# Patient Record
Sex: Male | Born: 1955 | Race: White | Hispanic: No | Marital: Married | Smoking: Never smoker
Health system: Southern US, Community
[De-identification: ages and names within clinical notes are randomized; demographics above are authoritative.]

## PROBLEM LIST (undated history)

## (undated) DIAGNOSIS — I10 Essential (primary) hypertension: Secondary | ICD-10-CM

---

## 2011-03-20 ENCOUNTER — Inpatient Hospital Stay: Payer: Self-pay | Admitting: *Deleted

## 2011-03-28 ENCOUNTER — Ambulatory Visit: Payer: Self-pay | Admitting: Internal Medicine

## 2011-06-08 ENCOUNTER — Emergency Department: Payer: Self-pay | Admitting: Emergency Medicine

## 2011-06-09 ENCOUNTER — Ambulatory Visit: Payer: Self-pay | Admitting: Internal Medicine

## 2011-08-10 ENCOUNTER — Emergency Department: Payer: Self-pay | Admitting: Internal Medicine

## 2012-05-16 IMAGING — CR DG CHEST 2V
1 series · 2 of 2 positions shown · non-contrast
Comparison: none

REASON FOR EXAM: pain
COMMENTS:   May transport without cardiac monitor

PROCEDURE:     DXR - DXR CHEST PA (OR AP) AND LATERAL  - March 20, 2011  [DATE]
RESULT:     Comparison: None

[Series 1: view not recorded · 0.17mm/px · 2 of 2 slices shown]
[im 1/2]
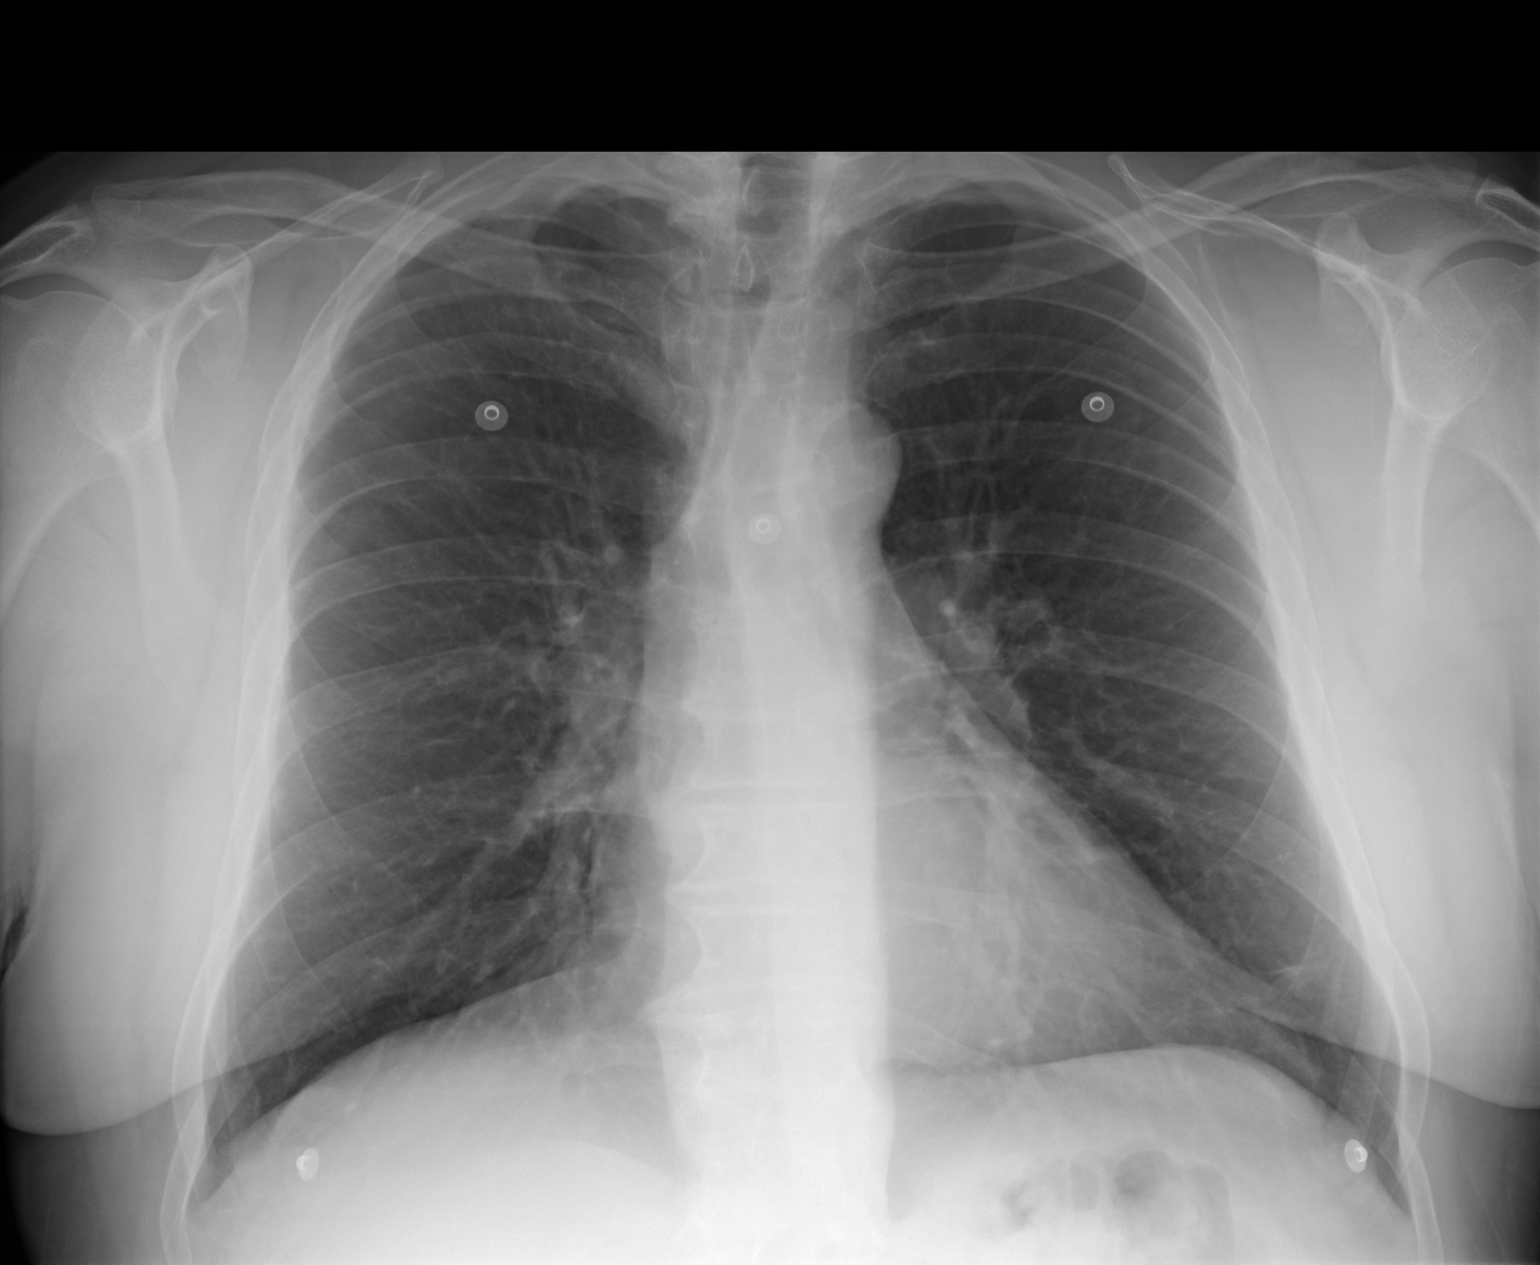
[im 2/2]
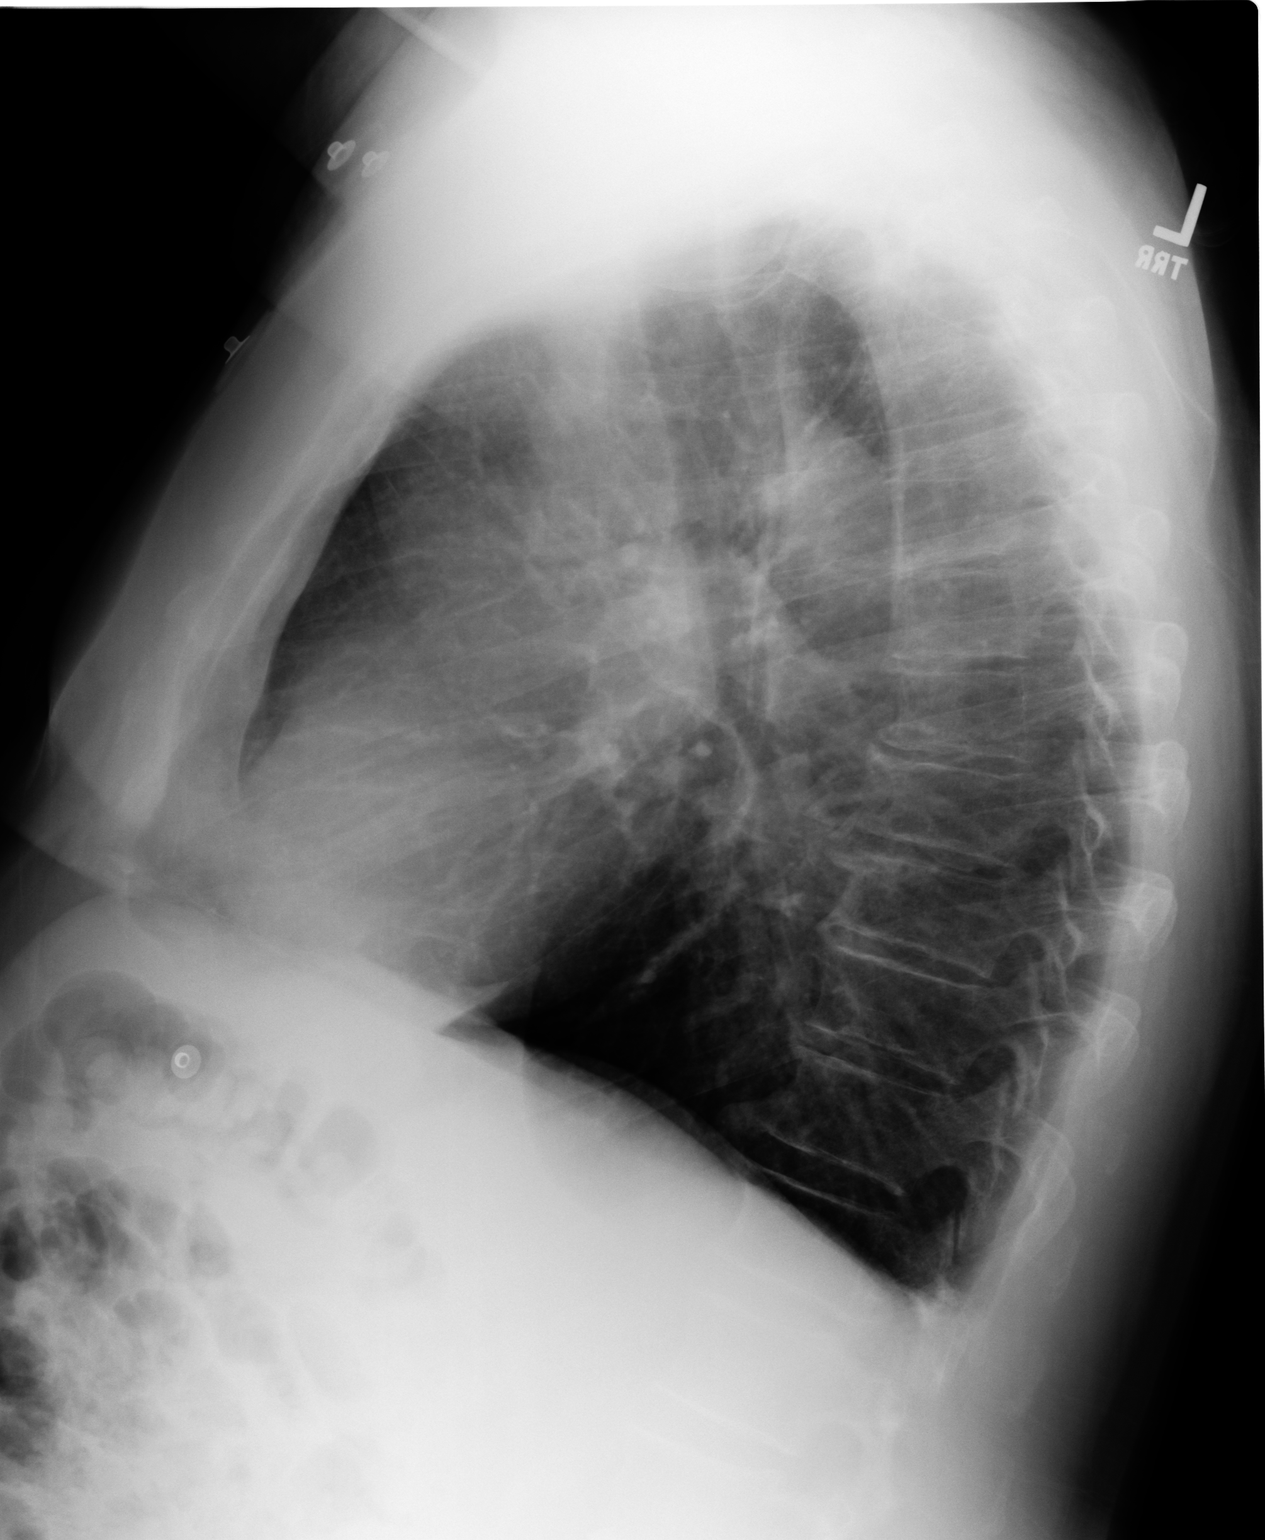

[2 of 2 positions shown; findings below may reference images not displayed]

FINDINGS: Heart is normal in size. Minimal linear opacities in the periphery the left
lower lung likely represent subsegmental atelectasis.
IMPRESSION: Minimal subsegmental atelectasis or scarring in the left lower lung.

## 2012-05-17 IMAGING — US US CAROTID DUPLEX BILAT
1 series · 17 of 24 positions shown · non-contrast
Comparison: none

REASON FOR EXAM: syncope
COMMENTS:

PROCEDURE:     US  - US CAROTID DOPPLER BILATERAL  - March 21, 2011  [DATE]
RESULT:     Comparison: None
TECHNIQUE: Gray-scale, color Doppler, and spectral Doppler images were
obtained of the extracranial carotid artery systems and vertebral arteries
in the neck.

[Series 1: us carotid duplex bilat · 17 of 67 slices shown]
[im 1/67]
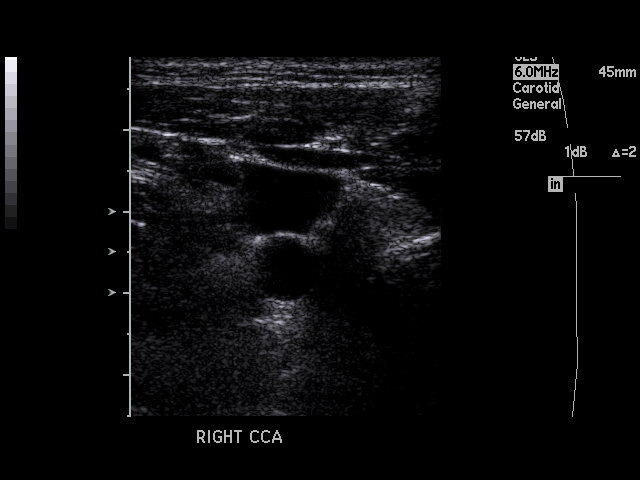
[im 6/67]
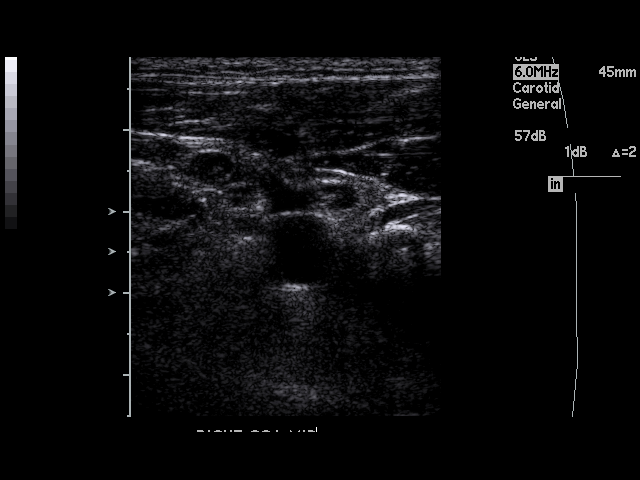
[im 9/67]
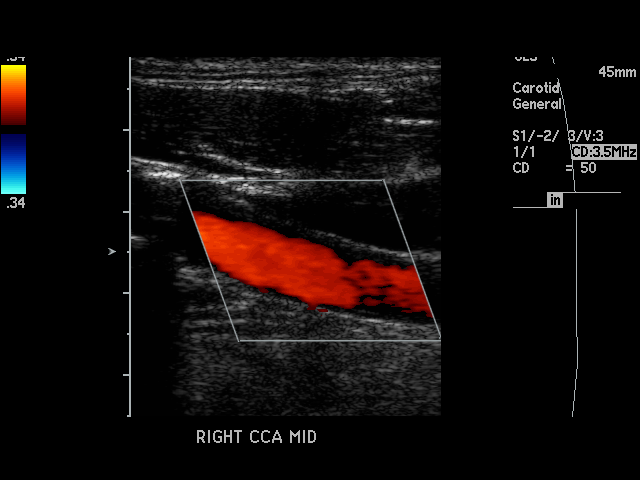
[im 12/67]
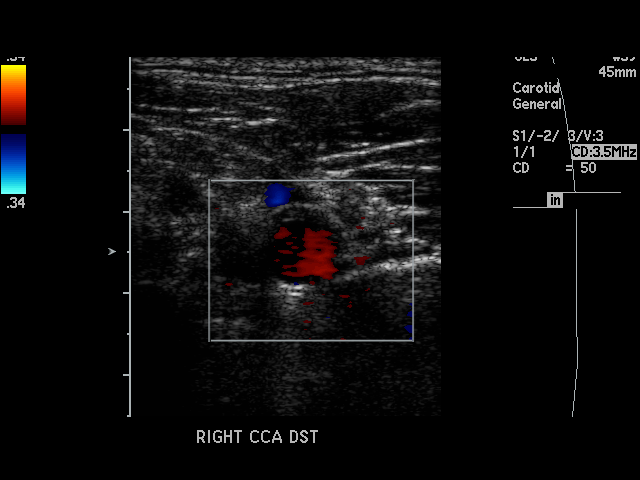
[im 18/67]
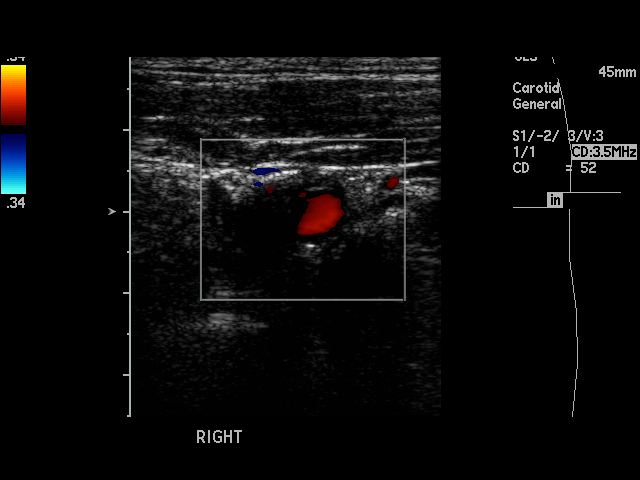
[im 21/67]
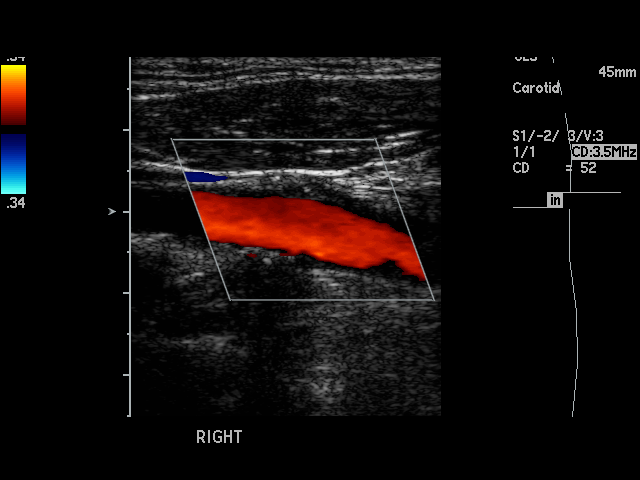
[im 26/67]
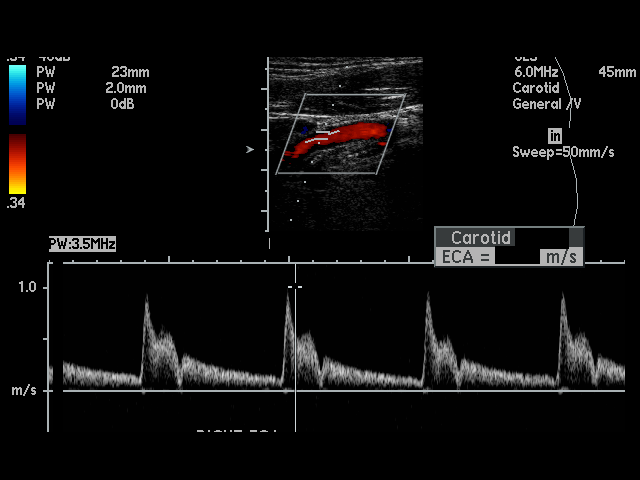
[im 29/67]
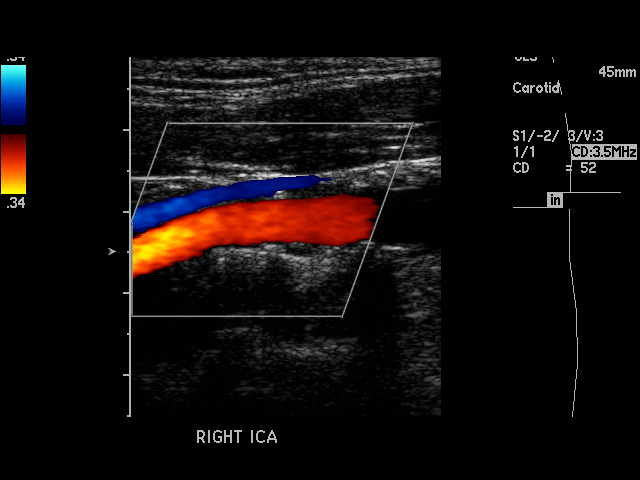
[im 35/67]
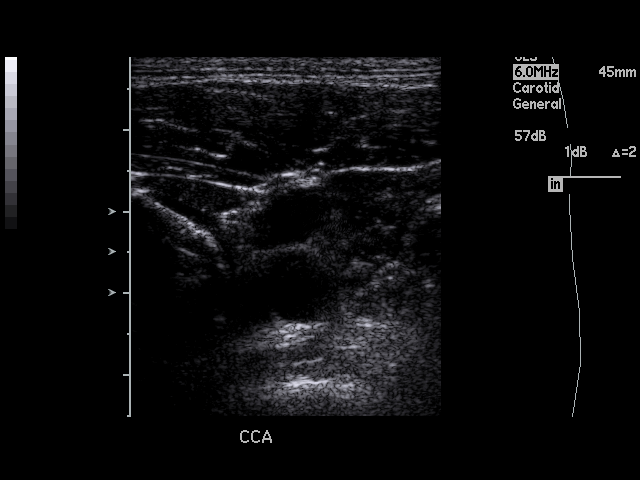
[im 38/67]
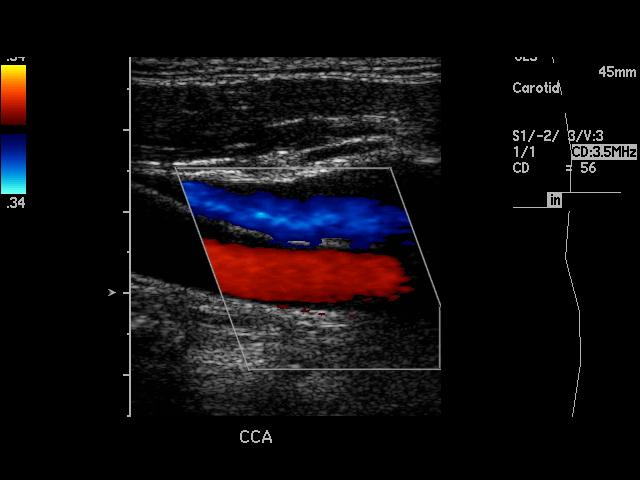
[im 41/67]
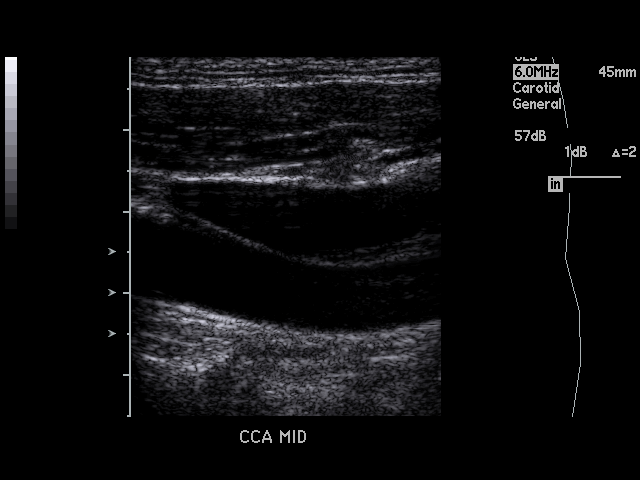
[im 46/67]
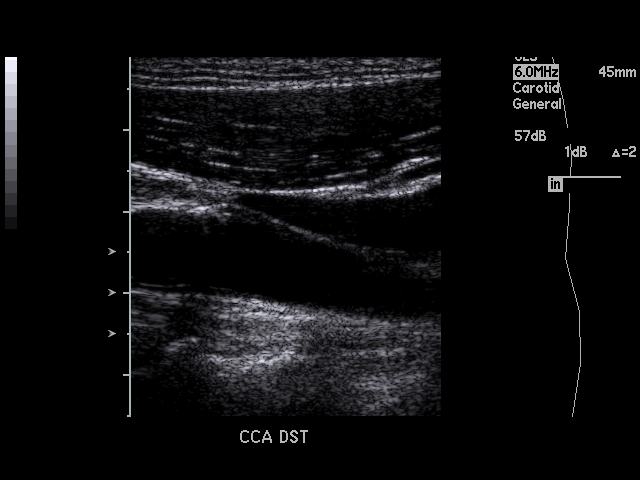
[im 49/67]
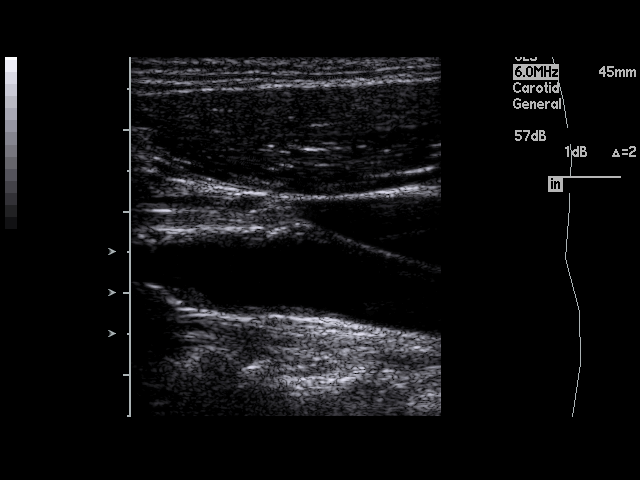
[im 55/67]
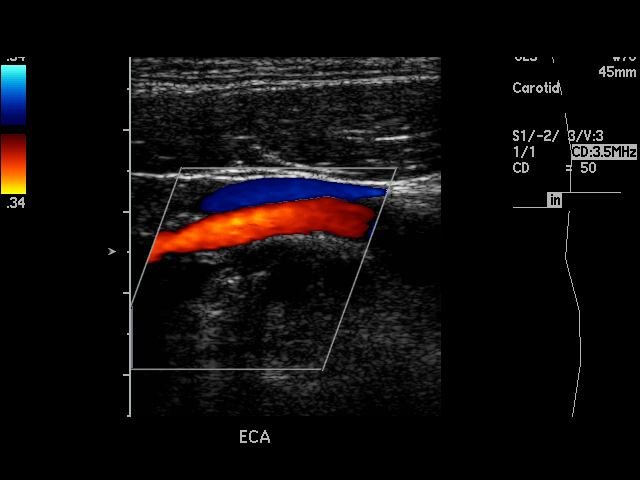
[im 58/67]
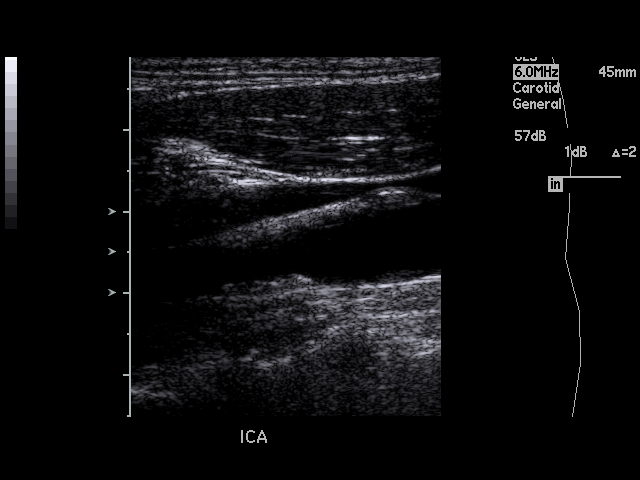
[im 61/67]
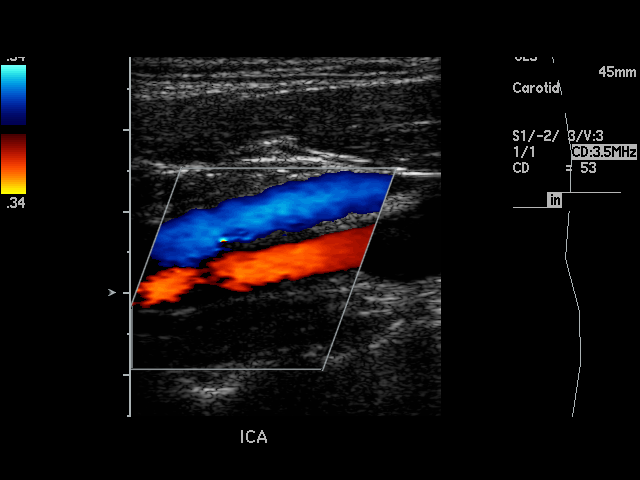
[im 67/67]
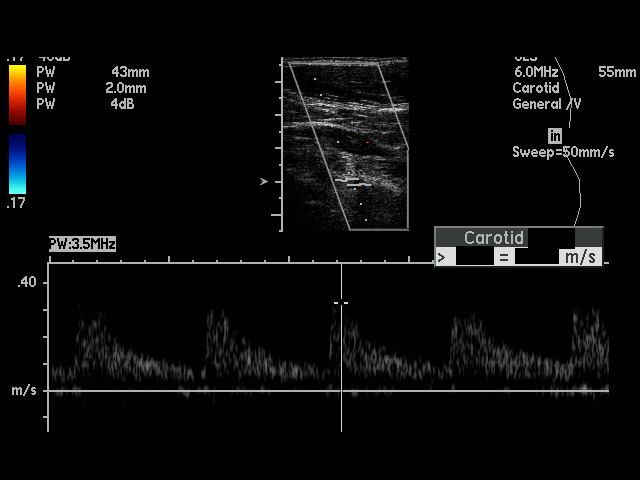

[17 of 24 positions shown; findings below may reference images not displayed]

FINDINGS: Mild atherosclerotic plaques are demonstrated in the carotid bulbs and
internal carotid arteries, bilaterally. By visual inspection there is less
than 50% stenosis. The peak systolic velocities are not elevated. The right
ICA to CCA ratio is 1.1. The left ICA to CCA ratio is 1.0. The vertebral
arteries are patent and antegrade flow.
IMPRESSION: No evidence of hemodynamically significant stenosis in the extracranial
carotid arteries.

## 2012-10-06 IMAGING — CR DG KNEE 1-2V*R*
1 series · 2 of 2 positions shown · non-contrast
Comparison: none

REASON FOR EXAM: fall, pain
COMMENTS:

[Series 1: view not recorded · 0.17mm/px · 2 of 2 slices shown]
[im 1/2]
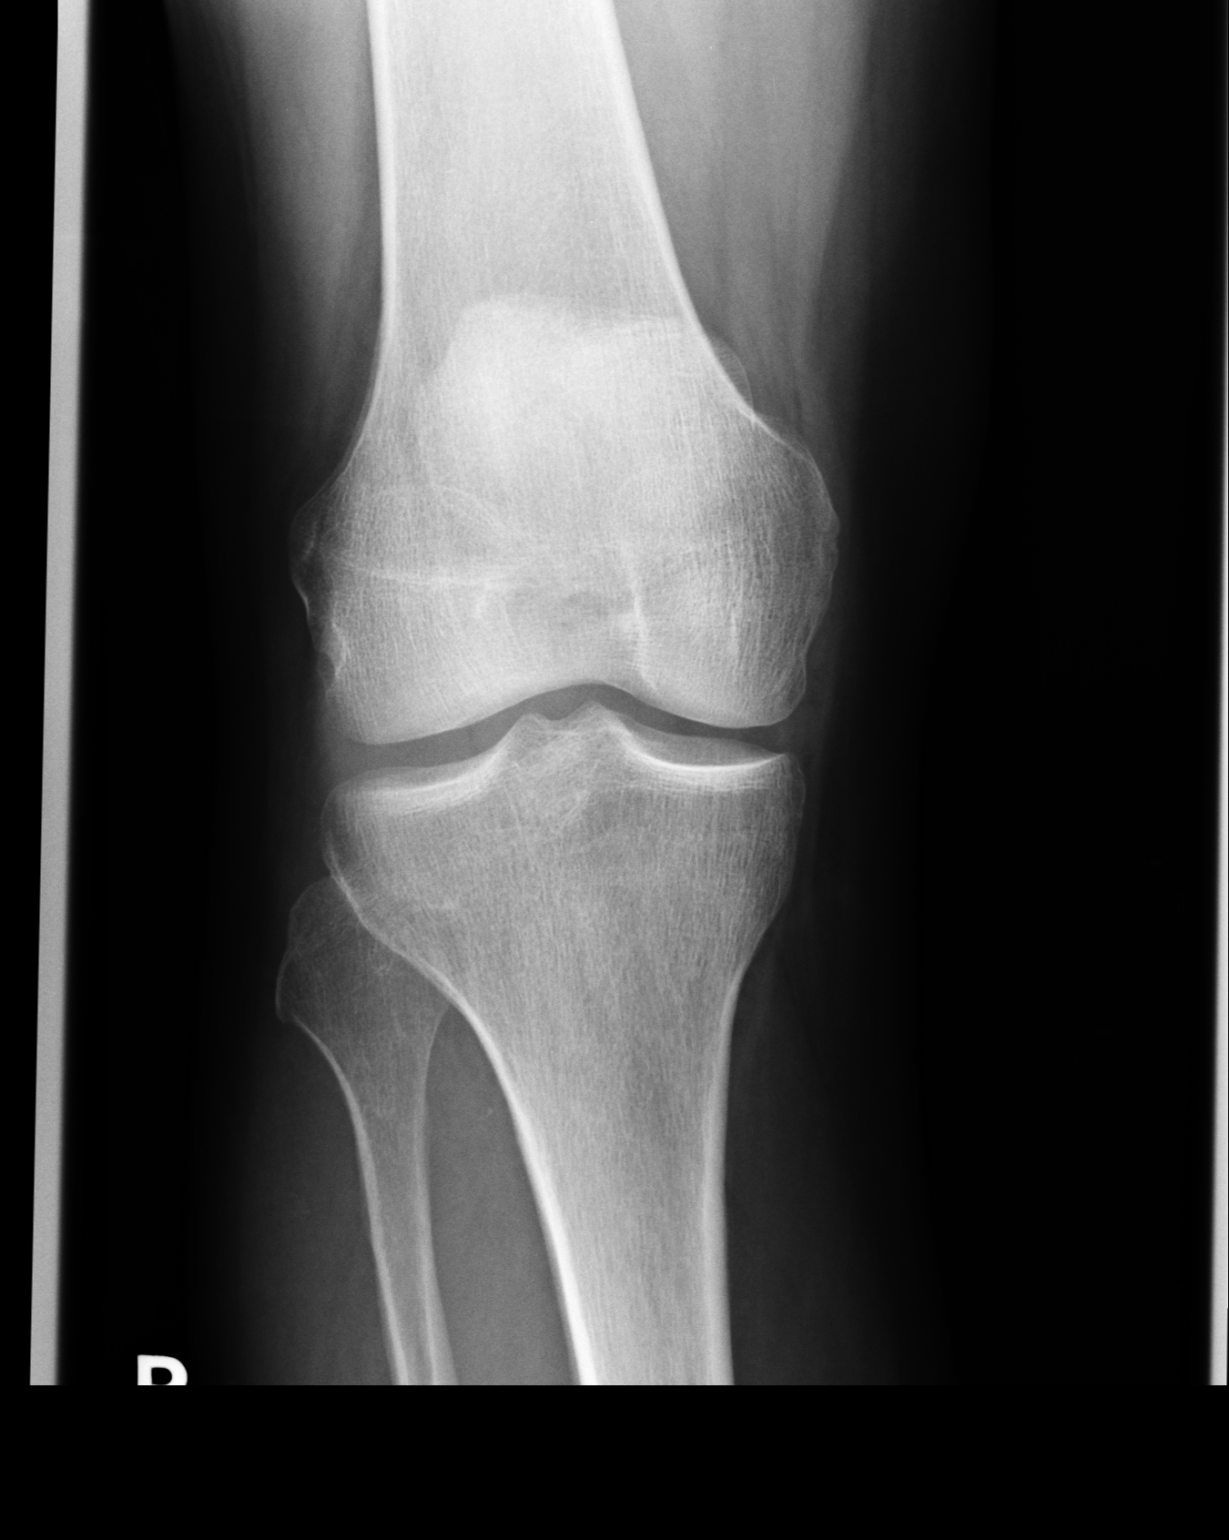
[im 2/2]
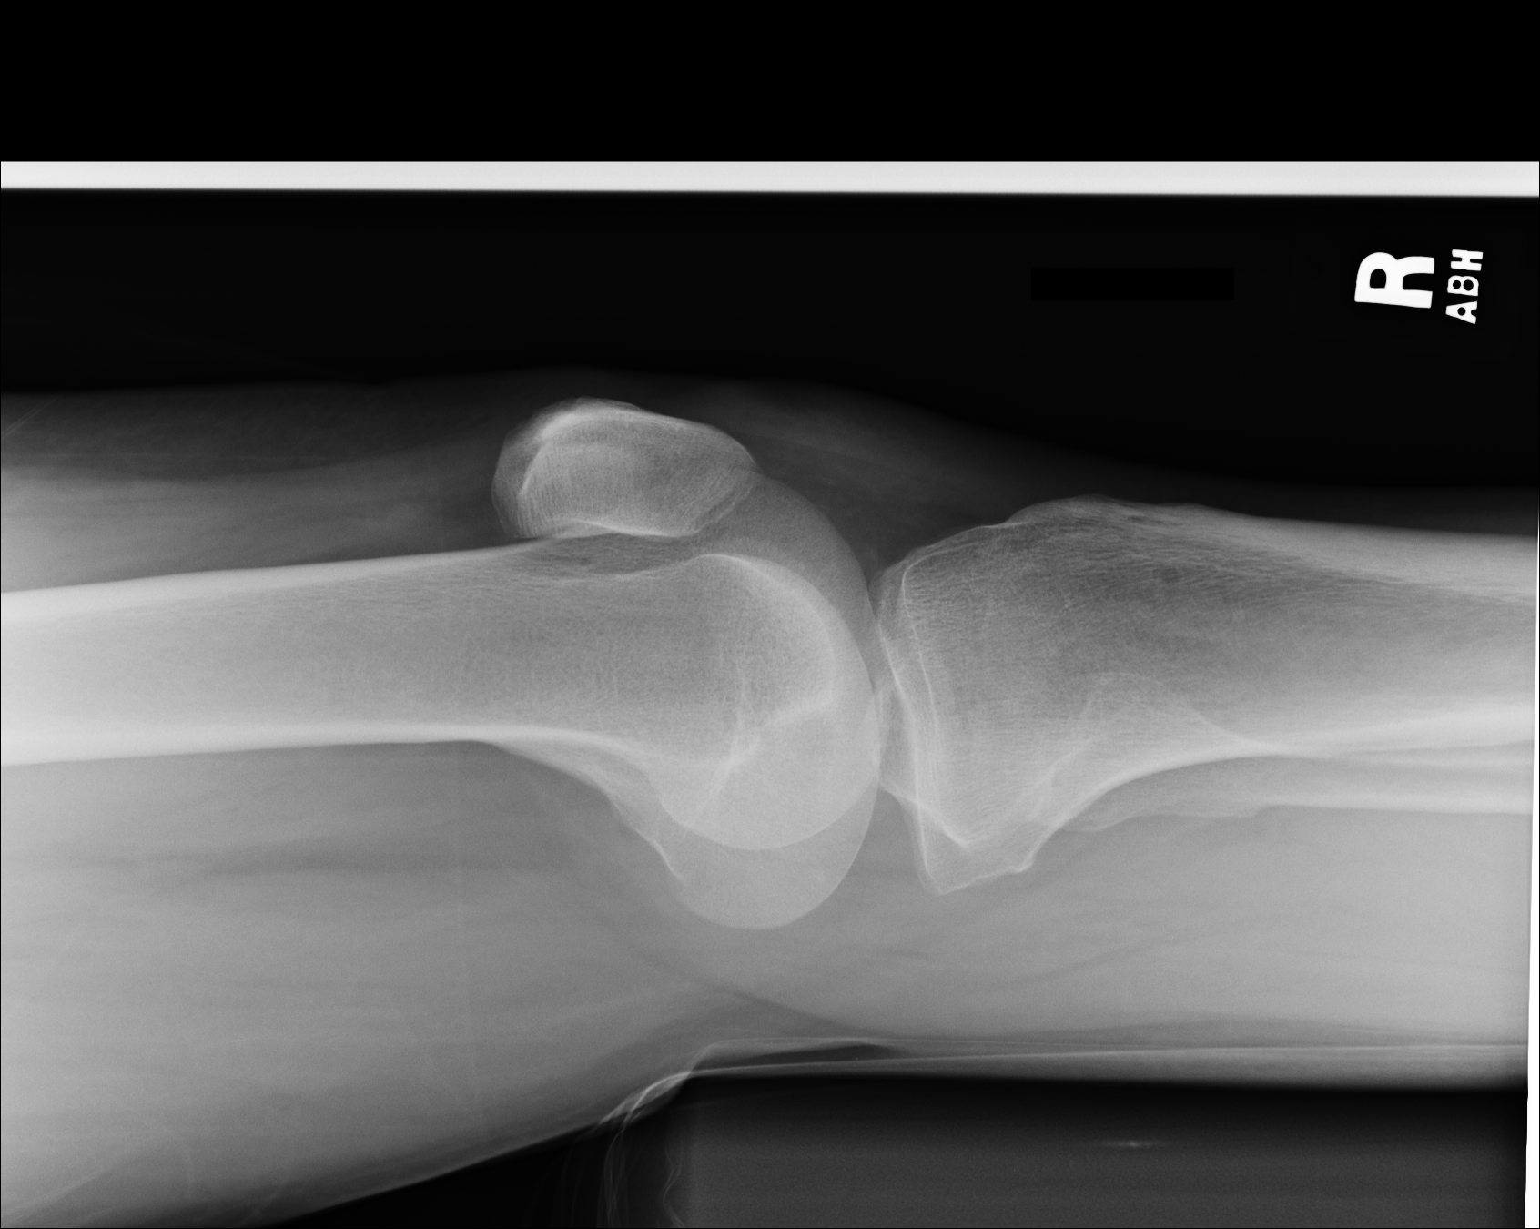

[2 of 2 positions shown; findings below may reference images not displayed]

PROCEDURE:     DXR - DXR KNEE RIGHT AP AND LATERAL  - August 10, 2011  [DATE]

RESULT:     AP and lateral views of the right knee reveal the bones to be
adequately mineralized. I do not see evidence of an acute tibial or femoral
fracture or dislocation. There is subtle contour deformity of the lateral
aspect of the right fibula. A definite fracture line is not demonstrated. No
significant degenerative change is seen. I see no evidence of a joint
effusion.
IMPRESSION: I do not see definite evidence of an acute fracture of the
bones of the right knee. Correlation clinically as to any symptoms over the
proximal fibula is recommended to assure that a poorly visualized fracture
is not present.

## 2013-03-29 ENCOUNTER — Ambulatory Visit: Payer: Self-pay | Admitting: Emergency Medicine

## 2013-03-29 LAB — DOT URINE DIP
Glucose,UR: NEGATIVE mg/dL (ref 0–75)
Protein: NEGATIVE
Specific Gravity: 1.005 (ref 1.003–1.030)

## 2013-03-29 LAB — URINALYSIS, COMPLETE
Bilirubin,UR: NEGATIVE
Glucose,UR: NEGATIVE mg/dL (ref 0–75)
Ketone: NEGATIVE
Leukocyte Esterase: NEGATIVE
Nitrite: NEGATIVE
Ph: 6 (ref 4.5–8.0)
Protein: NEGATIVE
Specific Gravity: 1.005 (ref 1.003–1.030)

## 2013-04-10 ENCOUNTER — Ambulatory Visit: Payer: Self-pay | Admitting: Family Medicine

## 2013-04-10 LAB — DOT URINE DIP
Glucose,UR: NEGATIVE mg/dL (ref 0–75)
Protein: NEGATIVE
Specific Gravity: 1.01 (ref 1.003–1.030)

## 2015-06-28 ENCOUNTER — Telehealth: Payer: Self-pay | Admitting: Unknown Physician Specialty

## 2015-06-28 NOTE — Telephone Encounter (Signed)
Patient returned my call. Patient is starting a new job on Monday and had to have some things done. Patient had a physical in [place] on Tuesday and was told he could get a  stress test done anywhere. Patient is very confused. Is this something we need to put an order in for? Or should he see his cardiologist?

## 2015-06-28 NOTE — Telephone Encounter (Signed)
Tried to call patient but there was no answer so I left him a voicemail to return my call.

## 2015-06-28 NOTE — Telephone Encounter (Signed)
PT CAME IN AND HAS SOME THINGS HE NEEDS TO SPEAK TO CHERYL ABOUT SOME THINGS THAT HE DOESN'T UNDERSTAND AND HE WASN'T WILLING TO TELL ME ANYTHING BECAUSE I CANT DO ANYTHING ABOUT IT. HE SAYS HE TOOK A PHYSICAL SOMEWHERE ELSE AND HE NEEDS A STRESS TEST EVERY 2 YEARS AND HE WAS UNAWARE OF THIS. HE SAYS HIS PHYSICAL CARD DOESN'T EXPIRE UNTIL NEXT YEAR. UNTIL HE GETS A STRESS TEST HE CANT GET HIS NEW CARD TO START HIS NEW JOB. HE IS SUPPOSED TO START HIS NEW JOB Monday. HIS LAST STRESS TEST WAS DONE 3 YEARS AGO.

## 2015-06-29 ENCOUNTER — Other Ambulatory Visit: Payer: Self-pay

## 2015-06-29 NOTE — Telephone Encounter (Signed)
Pt needs to see Cardiologist

## 2015-07-02 ENCOUNTER — Telehealth: Payer: Self-pay | Admitting: Unknown Physician Specialty

## 2015-07-02 NOTE — Telephone Encounter (Signed)
Patient was instructed to call his cardiologist to get his stress test done.

## 2016-10-07 ENCOUNTER — Emergency Department
Admission: EM | Admit: 2016-10-07 | Discharge: 2016-10-07 | Disposition: A | Discharge status: Home / Self Care | Payer: Self-pay | Attending: Emergency Medicine | Admitting: Emergency Medicine

## 2016-10-07 ENCOUNTER — Emergency Department: Payer: Self-pay

## 2016-10-07 ENCOUNTER — Encounter: Payer: Self-pay | Admitting: Emergency Medicine

## 2016-10-07 DIAGNOSIS — Y929 Unspecified place or not applicable: Secondary | ICD-10-CM | POA: Insufficient documentation

## 2016-10-07 DIAGNOSIS — I10 Essential (primary) hypertension: Secondary | ICD-10-CM | POA: Insufficient documentation

## 2016-10-07 DIAGNOSIS — Y990 Civilian activity done for income or pay: Secondary | ICD-10-CM | POA: Insufficient documentation

## 2016-10-07 DIAGNOSIS — Y93F2 Activity, caregiving, lifting: Secondary | ICD-10-CM | POA: Insufficient documentation

## 2016-10-07 DIAGNOSIS — S86812A Strain of other muscle(s) and tendon(s) at lower leg level, left leg, initial encounter: Secondary | ICD-10-CM | POA: Insufficient documentation

## 2016-10-07 DIAGNOSIS — S86112A Strain of other muscle(s) and tendon(s) of posterior muscle group at lower leg level, left leg, initial encounter: Secondary | ICD-10-CM

## 2016-10-07 DIAGNOSIS — X500XXA Overexertion from strenuous movement or load, initial encounter: Secondary | ICD-10-CM | POA: Insufficient documentation

## 2016-10-07 HISTORY — DX: Essential (primary) hypertension: I10

## 2016-10-07 MED ORDER — TRAMADOL HCL 50 MG PO TABS: 50.0000 mg | ORAL_TABLET | Freq: Four times a day (QID) | ORAL | 0 refills | Status: AC | PRN

## 2016-10-07 MED ORDER — IBUPROFEN 800 MG PO TABS: 800.0000 mg | ORAL_TABLET | Freq: Three times a day (TID) | ORAL | 0 refills | Status: AC | PRN

## 2016-10-07 NOTE — ED Notes (Signed)
Patient transported to Ultrasound 

## 2016-10-07 NOTE — Discharge Instructions (Signed)
Wear splint and ambulate with crutches 3-5 days as directed. Follow orthopedics if no improvement in one week.

## 2016-10-07 NOTE — ED Triage Notes (Signed)
Pt states he hurt his left leg at work on Monday. Pt ambulated into triage with no difficulty noted.

## 2016-10-07 NOTE — ED Provider Notes (Signed)
[facility] Emergency Department Provider Note   ____________________________________________   First MD Initiated Contact with Patient 10/07/16 1204     (approximate)  I have reviewed the triage vital signs and the nursing notes.   HISTORY  Chief Complaint Leg Pain    HPI Matthew Golden is a 60 y.o. male patient complaining of left calf pain secondary to lifting incident at work yesterday. Patient state this morning he noticed increased swelling to the calf and ecchymosis to the lateral inferior ankle. Patient stated pain increases with ambulation. Patient placed in Ace wrap on the catheter yesterday. Patient takes Plavix every other day and a baby aspirin every day. Patient also has a heart stent and takes 3 blood pressure medications. Patient rates his pain today is 8/10. Patient is denying any chest pain or shortness of breath. No other palliative measures taken for this complaint. Patient state pain also increase of his leg with dorsal flexion.   Past Medical History:  Diagnosis Date  . Hypertension     There are no active problems to display for this patient.   History reviewed. No pertinent surgical history.  Prior to Admission medications   Medication Sig Start Date End Date Taking? Authorizing Provider  ibuprofen (ADVIL,MOTRIN) 800 MG tablet Take 1 tablet (800 mg total) by mouth every 8 (eight) hours as needed. 10/07/16   Joni Reiningonald K Lonnetta Kniskern, PA-C  traMADol (ULTRAM) 50 MG tablet Take 1 tablet (50 mg total) by mouth every 6 (six) hours as needed. 10/07/16 10/07/17  Joni Reiningonald K Naydeline Morace, PA-C    Allergies Review of patient's allergies indicates no known allergies.  No family history on file.  Social History Social History  Substance Use Topics  . Smoking status: Never Smoker  . Smokeless tobacco: Never Used  . Alcohol use No    Review of Systems Constitutional: No fever/chills Eyes: No visual changes. ENT: No sore throat. Cardiovascular:  Denies chest pain. Respiratory: Denies shortness of breath. Gastrointestinal: No abdominal pain.  No nausea, no vomiting.  No diarrhea.  No constipation. Genitourinary: Negative for dysuria. Musculoskeletal: Left calf pain. Skin: Negative for rash. Neurological: Negative for headaches, focal weakness or numbness. Endocrine:Hypertension ____________________________________________   PHYSICAL EXAM:  VITAL SIGNS: ED Triage Vitals  Enc Vitals Group     BP 10/07/16 1129 (!) 155/67     Pulse Rate 10/07/16 1129 73     Resp 10/07/16 1129 18     Temp 10/07/16 1129 97.8 F (36.6 C)     Temp Source 10/07/16 1129 Oral     SpO2 10/07/16 1129 95 %     Weight 10/07/16 1129 233 lb (105.7 kg)     Height 10/07/16 1129 5\' 10"  (1.778 m)     Head Circumference --      Peak Flow --      Pain Score 10/07/16 1130 8     Pain Loc --      Pain Edu? --      Excl. in GC? --     Constitutional: Alert and oriented. Well appearing and in no acute distress. Eyes: Conjunctivae are normal. PERRL. EOMI. Head: Atraumatic. Nose: No congestion/rhinnorhea. Mouth/Throat: Mucous membranes are moist.  Oropharynx non-erythematous. Neck: No stridor.  No cervical spine tenderness to palpation. Hematological/Lymphatic/Immunilogical: No cervical lymphadenopathy. Cardiovascular: Normal rate, regular rhythm. Grossly normal heart sounds.  Good peripheral circulation.Elevated blood pressure Respiratory: Normal respiratory effort.  No retractions. Lungs CTAB. Gastrointestinal: Soft and nontender. No distention. No abdominal bruits. No CVA tenderness. Musculoskeletal:  No obvious deformity left lower leg. Hours edema to the left calf. Patient is moderate guarding palpation of the left calf.  Neurologic:  Normal speech and language. No gross focal neurologic deficits are appreciated. No gait instability. Skin:  Skin is warm, dry and intact. No rash noted. Descended ecchymosis from the calf to the ankle. Psychiatric: Mood and  affect are normal. Speech and behavior are normal.  ____________________________________________   LABS (all labs ordered are listed, but only abnormal results are displayed)  Labs Reviewed - No data to display ____________________________________________  EKG   ____________________________________________  RADIOLOGY  No  DVT on ultrasound of the left leg. ____________________________________________   PROCEDURES  Procedure(s) performed: None  Procedures  Critical Care performed: No  ____________________________________________   INITIAL IMPRESSION / ASSESSMENT AND PLAN / ED COURSE  Pertinent labs & imaging results that were available during my care of the patient were reviewed by me and considered in my medical decision making (see chart for details).  Gastrocnemius strain left calf. Patient given discharge Instructions.  Clinical Course  Patient placed in a short leg cast and given crutches for ambulation.   ____________________________________________   FINAL CLINICAL IMPRESSION(S) / ED DIAGNOSES  Final diagnoses:  Gastrocnemius muscle strain, left, initial encounter      NEW MEDICATIONS STARTED DURING THIS VISIT:  New Prescriptions   IBUPROFEN (ADVIL,MOTRIN) 800 MG TABLET    Take 1 tablet (800 mg total) by mouth every 8 (eight) hours as needed.   TRAMADOL (ULTRAM) 50 MG TABLET    Take 1 tablet (50 mg total) by mouth every 6 (six) hours as needed.     Note:  This document was prepared using Dragon voice recognition software and may include unintentional dictation errors.    Joni Reining, PA-C 10/07/16 1345    Governor Rooks, MD 10/07/16 [phone]

## 2016-10-07 NOTE — ED Notes (Signed)
Called in the waiting room with no answer. °

## 2018-07-03 IMAGING — US US EXTREM LOW VENOUS*L*
1 series · 14 of 24 positions shown · non-contrast
Comparison: None.

CLINICAL DATA: Left calf pain for 8 days.

EXAM:
LEFT LOWER EXTREMITY VENOUS DOPPLER ULTRASOUND
TECHNIQUE: Gray-scale sonography with graded compression, as well as color
Doppler and duplex ultrasound, were performed to evaluate the deep
venous system from the level of the common femoral vein through the
popliteal and proximal calf veins. Spectral Doppler was utilized to
evaluate flow at rest and with distal augmentation maneuvers.

[Series 1: us extrem low venous*left* · 0.08mm/px · 14 of 39 slices shown]
[im 1/39]
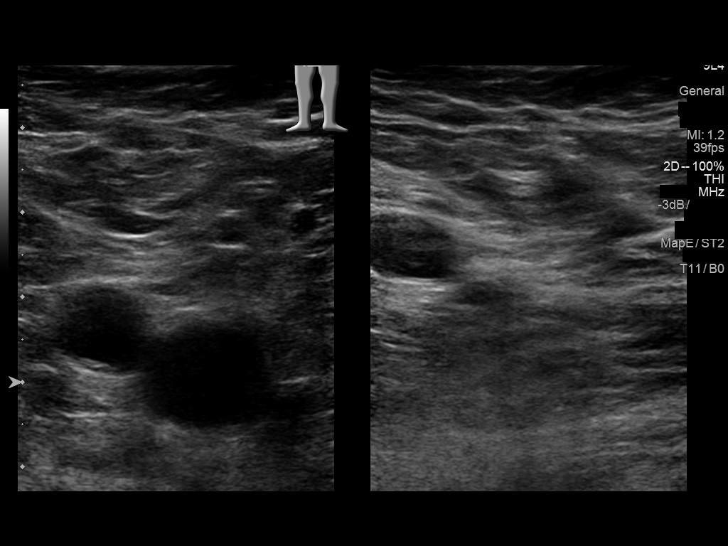
[im 4/39]
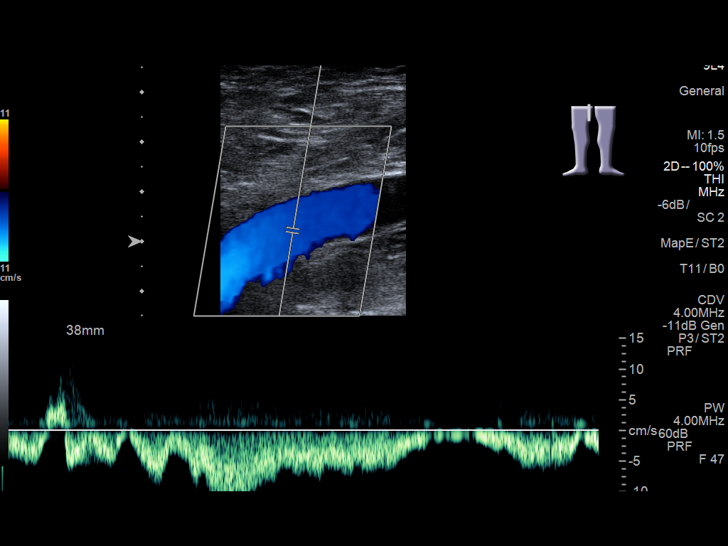
[im 7/39]
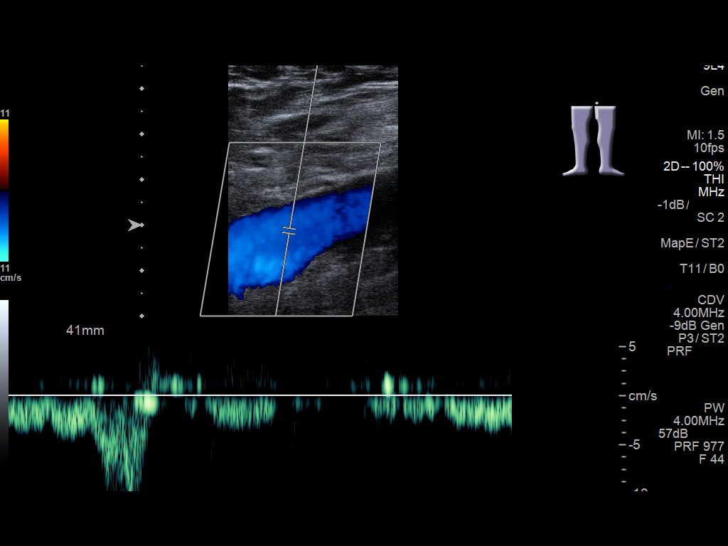
[im 10/39]
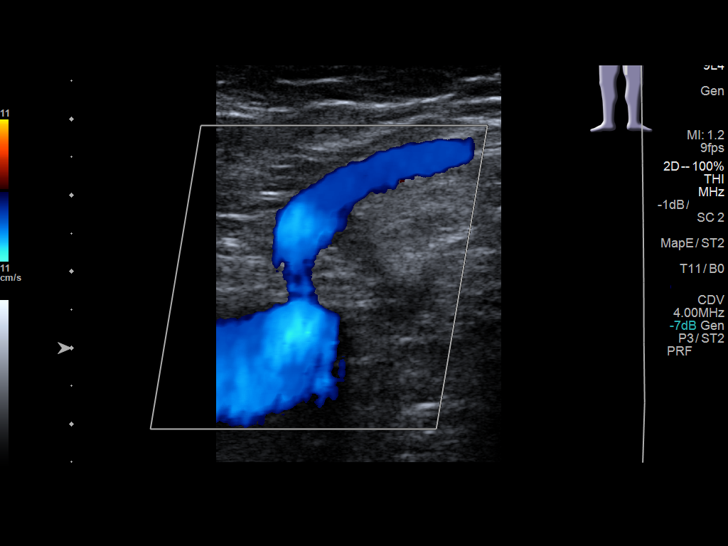
[im 12/39]
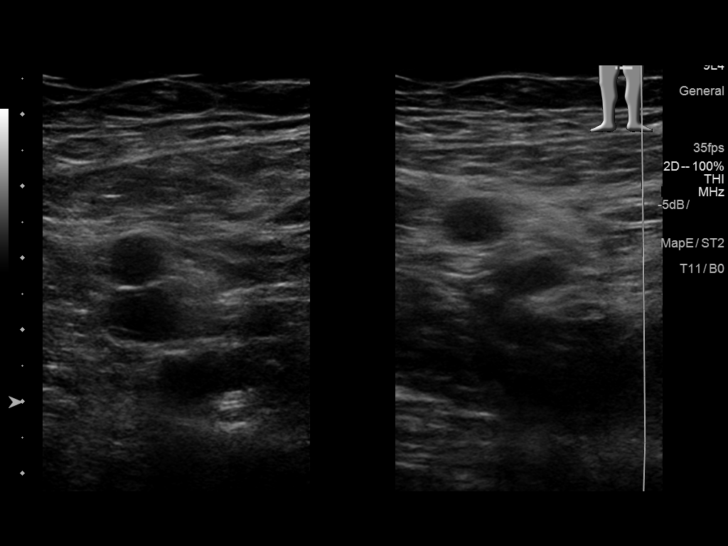
[im 15/39]
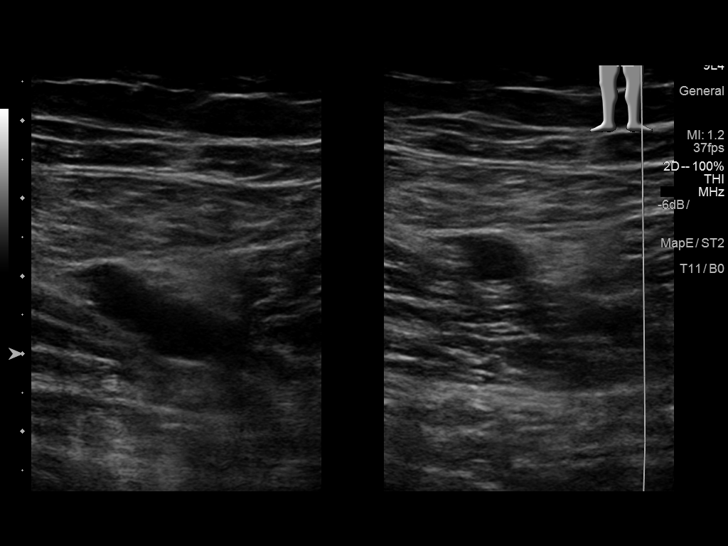
[im 19/39]
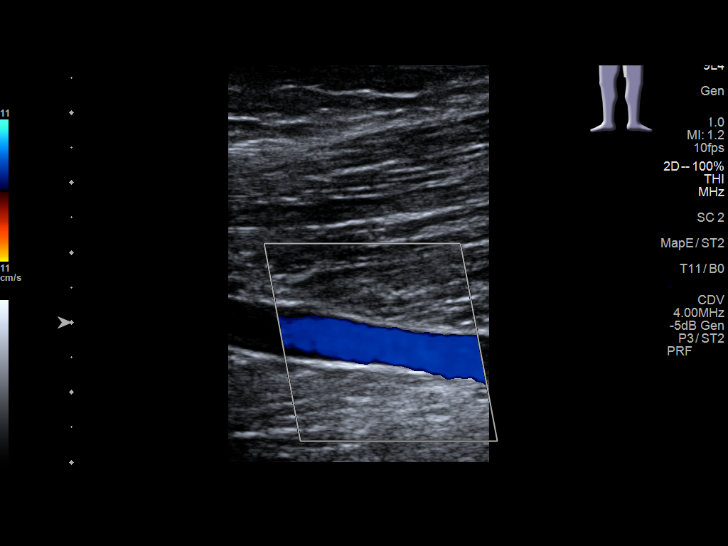
[im 20/39]
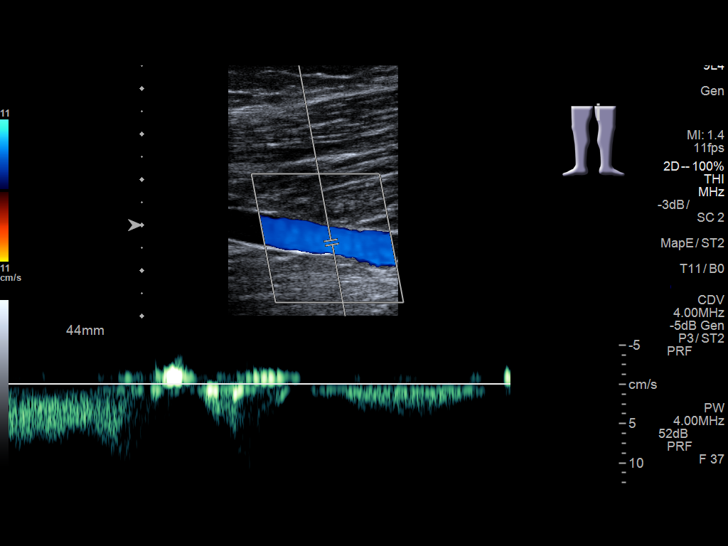
[im 24/39]
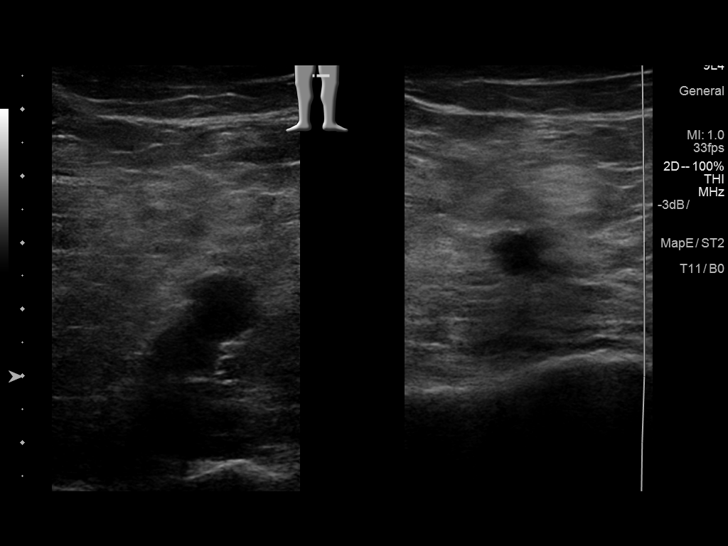
[im 27/39]
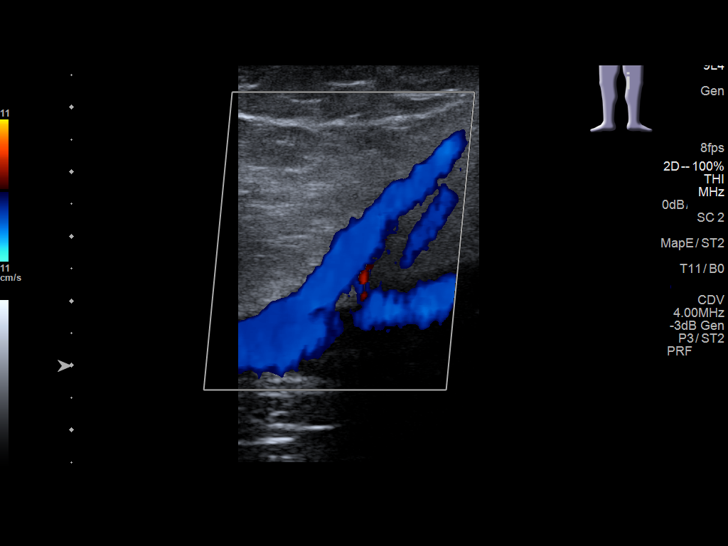
[im 30/39]
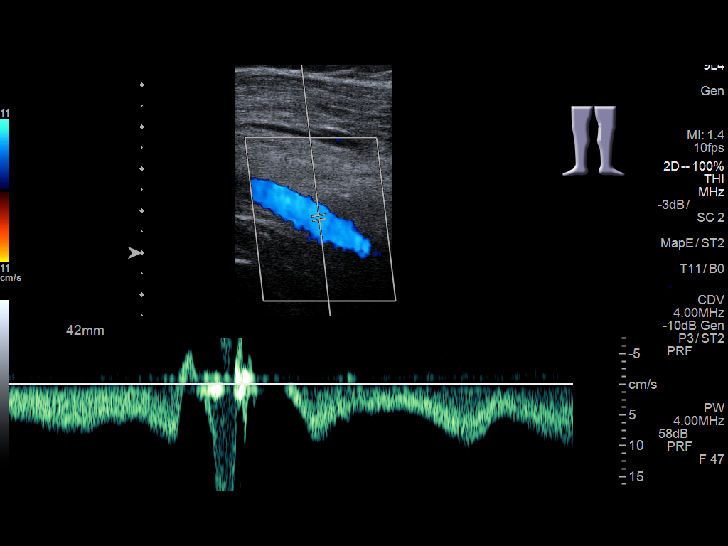
[im 32/39]
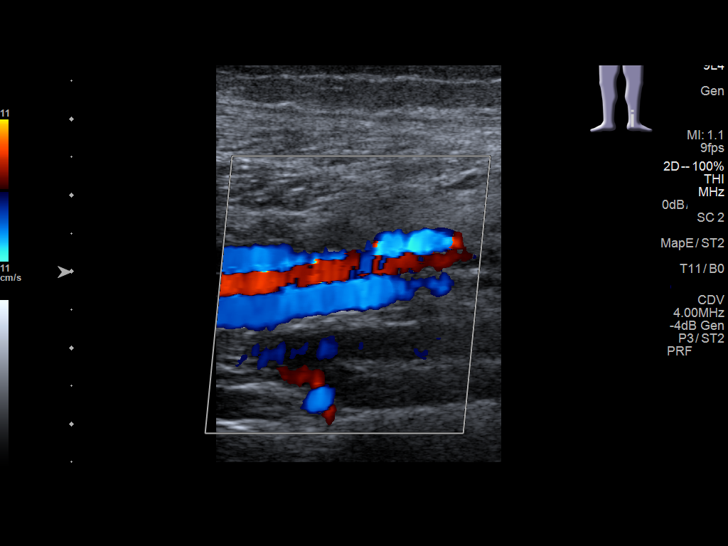
[im 35/39]
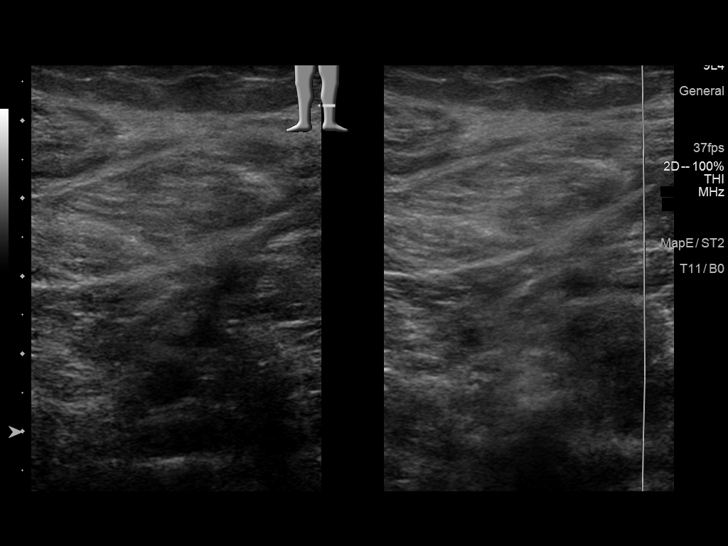
[im 39/39]
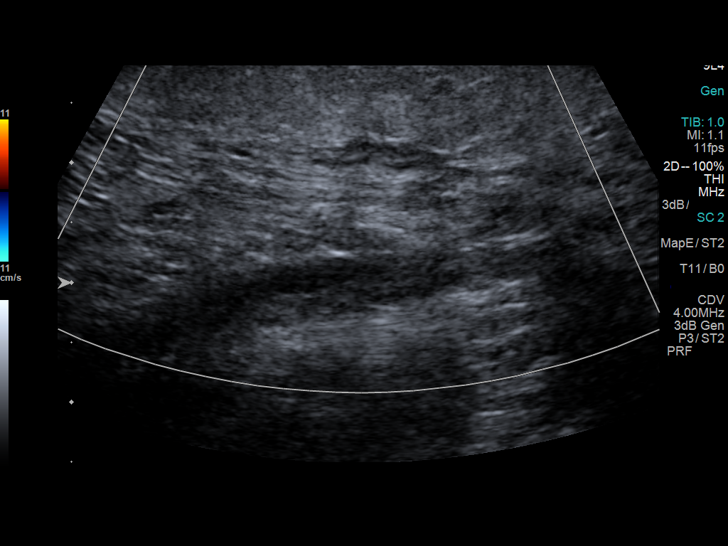

[14 of 24 positions shown; findings below may reference images not displayed]

FINDINGS: Right common femoral vein is patent without thrombus.

Normal compressibility, augmentation and color Doppler flow in the
left common femoral vein, left femoral vein and left popliteal vein.
The left saphenofemoral junction is patent. Left profunda femoral
vein is patent without thrombus. Visualized left deep calf veins are
patent without thrombus.
IMPRESSION: Negative for deep venous thrombosis in left lower extremity.
